# Patient Record
Sex: Male | Born: 1980 | Race: Black or African American | Hispanic: No | Marital: Single | Smoking: Never smoker
Health system: Southern US, Community
[De-identification: ages and names within clinical notes are randomized; demographics above are authoritative.]

## PROBLEM LIST (undated history)

## (undated) HISTORY — PX: OTHER SURGICAL HISTORY: SHX169

---

## 2009-03-28 ENCOUNTER — Ambulatory Visit: Payer: Self-pay | Admitting: Diagnostic Radiology

## 2009-03-28 ENCOUNTER — Emergency Department (HOSPITAL_BASED_OUTPATIENT_CLINIC_OR_DEPARTMENT_OTHER): Admission: EM | Admit: 2009-03-28 | Discharge: 2009-03-28 | Payer: Self-pay | Admitting: Emergency Medicine

## 2010-06-10 LAB — DIFFERENTIAL
Basophils Absolute: 0.1 10*3/uL (ref 0.0–0.1)
Eosinophils Absolute: 0.3 10*3/uL (ref 0.0–0.7)
Eosinophils Relative: 6 % — ABNORMAL HIGH (ref 0–5)
Monocytes Absolute: 0.4 10*3/uL (ref 0.1–1.0)
Monocytes Relative: 7 % (ref 3–12)
Neutro Abs: 2.4 10*3/uL (ref 1.7–7.7)

## 2010-06-10 LAB — D-DIMER, QUANTITATIVE: D-Dimer, Quant: 0.3 ug/mL-FEU (ref 0.00–0.48)

## 2010-06-10 LAB — CBC
MCV: 90.7 fL (ref 78.0–100.0)
Platelets: 207 10*3/uL (ref 150–400)
RDW: 12.4 % (ref 11.5–15.5)
WBC: 5.2 10*3/uL (ref 4.0–10.5)

## 2011-09-03 ENCOUNTER — Emergency Department (HOSPITAL_BASED_OUTPATIENT_CLINIC_OR_DEPARTMENT_OTHER)
Admission: EM | Admit: 2011-09-03 | Discharge: 2011-09-03 | Disposition: A | Discharge status: Home / Self Care | Payer: 59 | Attending: Emergency Medicine | Admitting: Emergency Medicine

## 2011-09-03 ENCOUNTER — Encounter (HOSPITAL_BASED_OUTPATIENT_CLINIC_OR_DEPARTMENT_OTHER): Payer: Self-pay | Admitting: Emergency Medicine

## 2011-09-03 DIAGNOSIS — X500XXA Overexertion from strenuous movement or load, initial encounter: Secondary | ICD-10-CM | POA: Insufficient documentation

## 2011-09-03 DIAGNOSIS — Y998 Other external cause status: Secondary | ICD-10-CM | POA: Insufficient documentation

## 2011-09-03 DIAGNOSIS — Y9389 Activity, other specified: Secondary | ICD-10-CM | POA: Insufficient documentation

## 2011-09-03 DIAGNOSIS — IMO0002 Reserved for concepts with insufficient information to code with codable children: Secondary | ICD-10-CM | POA: Insufficient documentation

## 2011-09-03 DIAGNOSIS — S39012A Strain of muscle, fascia and tendon of lower back, initial encounter: Secondary | ICD-10-CM

## 2011-09-03 DIAGNOSIS — Z79899 Other long term (current) drug therapy: Secondary | ICD-10-CM | POA: Insufficient documentation

## 2011-09-03 MED ORDER — KETOROLAC TROMETHAMINE 30 MG/ML IJ SOLN
30.0000 mg | Freq: Once | INTRAMUSCULAR | Status: AC
  Administered 2011-09-03: 30 mg via INTRAMUSCULAR
  Filled 2011-09-03: qty 1

## 2011-09-03 MED ORDER — DIAZEPAM 5 MG PO TABS: 5.0000 mg | ORAL_TABLET | Freq: Two times a day (BID) | ORAL | Status: AC | PRN

## 2011-09-03 MED ORDER — TRAMADOL HCL 50 MG PO TABS: 50.0000 mg | ORAL_TABLET | Freq: Four times a day (QID) | ORAL | Status: AC | PRN

## 2011-09-03 MED ORDER — IBUPROFEN 800 MG PO TABS: 800.0000 mg | ORAL_TABLET | Freq: Three times a day (TID) | ORAL | Status: AC

## 2011-09-03 NOTE — Discharge Instructions (Signed)
As we discussed, it is important to take the prescriptions as directed.  Please do not drive or operate any heavy machinery while taking the Valium or tramadol.  Please make sure to follow up as discussed, and consider physical therapy in addition to the exercises provided to you today for continued improvement of your back strain.

## 2011-09-03 NOTE — ED Notes (Signed)
Pt c/o lower middle back pain after working on lawnmower yesterday

## 2011-09-03 NOTE — ED Provider Notes (Signed)
History     CSN: [id number]  Arrival date & time 09/03/11  1478   First MD Initiated Contact with Patient 09/03/11 0831      Chief Complaint  Patient presents with  . Back Pain    (Consider location/radiation/quality/duration/timing/severity/associated sxs/prior treatment) HPI The patient presents with back pain.  He notes that approximately 24 hours ago bent over to fix something and since that time he said pain persists only about the mid lower back.  The pain is sharp, radiation bilaterally, it is worse with motion.  He has not taken any medication thus far.  He denies any ongoing fever, chills, dysuria, incontinence, difficulty walking or other focal complaints. He notes to similar prior episodes in the distant past.  No surgical history, no other notable medical problems. History reviewed. No pertinent past medical history.  History reviewed. No pertinent past surgical history.  No family history on file.  History  Substance Use Topics  . Smoking status: Never Smoker   . Smokeless tobacco: Not on file  . Alcohol Use: Yes     social      Review of Systems  All other systems reviewed and are negative.    Allergies  Review of patient's allergies indicates no known allergies.  Home Medications   Current Outpatient Rx  Name Route Sig Dispense Refill  . DIAZEPAM 5 MG PO TABS Oral Take 1 tablet (5 mg total) by mouth every 12 (twelve) hours as needed for anxiety. 10 tablet 0  . IBUPROFEN 800 MG PO TABS Oral Take 1 tablet (800 mg total) by mouth 3 (three) times daily. 12 tablet 0  . TRAMADOL HCL 50 MG PO TABS Oral Take 1 tablet (50 mg total) by mouth every 6 (six) hours as needed for pain. 15 tablet 0    BP 156/59  Pulse 74  Temp 98 F (36.7 C)  Resp 16  Ht 6\' 1"  (1.854 m)  Wt 255 lb (115.667 kg)  BMI 33.64 kg/m2  SpO2 99%  Physical Exam  Nursing note and vitals reviewed. Constitutional: He is oriented to person, place, and time. He appears well-developed.  No distress.  HENT:  Head: Normocephalic and atraumatic.  Eyes: Conjunctivae and EOM are normal.  Cardiovascular: Normal rate and regular rhythm.   Pulmonary/Chest: Effort normal. No stridor. No respiratory distress.  Abdominal: He exhibits no distension.  Musculoskeletal: He exhibits no edema.       5/5 strength in both lower extremities symmetric throughout.  Appropriate pulses in the distal lower extremities.  Gait is intact.  Neurological: He is alert and oriented to person, place, and time.  Skin: Skin is warm and dry.  Psychiatric: He has a normal mood and affect.    ED Course  Procedures (including critical care time)  Labs Reviewed - No data to display No results found.   1. Back strain       MDM  This otherwise well male presents with new back pain secondary to likely muscle strain.  On exam the patient is in no distress, has no focal neurologic deficits.  The patient's vital signs are unremarkable.  Given these findings, the patient is appropriate for continued care via outpatient physicians.  I discussed this with the patient, including a discussion on the need for physical therapy, and he was discharged in stable condition to follow up at a clinic.   Gerhard Munch, MD 09/03/11 [phone]

## 2011-11-03 ENCOUNTER — Emergency Department (HOSPITAL_BASED_OUTPATIENT_CLINIC_OR_DEPARTMENT_OTHER)
Admission: EM | Admit: 2011-11-03 | Discharge: 2011-11-03 | Disposition: A | Discharge status: Home / Self Care | Payer: 59 | Attending: Emergency Medicine | Admitting: Emergency Medicine

## 2011-11-03 ENCOUNTER — Encounter (HOSPITAL_BASED_OUTPATIENT_CLINIC_OR_DEPARTMENT_OTHER): Payer: Self-pay | Admitting: Emergency Medicine

## 2011-11-03 DIAGNOSIS — K047 Periapical abscess without sinus: Secondary | ICD-10-CM | POA: Insufficient documentation

## 2011-11-03 MED ORDER — OXYCODONE-ACETAMINOPHEN 5-325 MG PO TABS: 1.0000 | ORAL_TABLET | Freq: Four times a day (QID) | ORAL | Status: AC | PRN

## 2011-11-03 MED ORDER — OXYCODONE-ACETAMINOPHEN 5-325 MG PO TABS
2.0000 | ORAL_TABLET | Freq: Once | ORAL | Status: AC
  Administered 2011-11-03: 2 via ORAL
  Filled 2011-11-03 (×2): qty 2

## 2011-11-03 MED ORDER — PENICILLIN V POTASSIUM 250 MG PO TABS
500.0000 mg | ORAL_TABLET | Freq: Once | ORAL | Status: AC
  Administered 2011-11-03: 500 mg via ORAL
  Filled 2011-11-03: qty 2

## 2011-11-03 MED ORDER — PENICILLIN V POTASSIUM 500 MG PO TABS: 500.0000 mg | ORAL_TABLET | Freq: Four times a day (QID) | ORAL | Status: AC

## 2011-11-03 NOTE — ED Provider Notes (Signed)
History     CSN: [id number]  Arrival date & time 11/03/11  1055   First MD Initiated Contact with Patient 11/03/11 1120      Chief Complaint  Patient presents with  . Dental Pain    (Consider location/radiation/quality/duration/timing/severity/associated sxs/prior treatment) HPI Pt reports mild pain in L lower canine tooth for the last several days, began to have swelling to gum and face yesterday, complaining of moderate aching pain, worse with palpation. No fever or drainage. Planning to see dentist tomorrow.  No past medical history on file.  Past Surgical History  Procedure Date  . Arm surgery     No family history on file.  History  Substance Use Topics  . Smoking status: Never Smoker   . Smokeless tobacco: Not on file  . Alcohol Use: Yes     social      Review of Systems All other systems reviewed and are negative except as noted in HPI.   Allergies  Review of patient's allergies indicates no known allergies.  Home Medications   Current Outpatient Rx  Name Route Sig Dispense Refill  . IBUPROFEN 800 MG PO TABS Oral Take 800 mg by mouth every 8 (eight) hours as needed.      BP 145/69  Pulse 80  Temp 98.1 F (36.7 C) (Oral)  Resp 20  Ht 6\' 1"  (1.854 m)  Wt 245 lb (111.131 kg)  BMI 32.32 kg/m2  SpO2 100%  Physical Exam  Constitutional: He is oriented to person, place, and time. He appears well-developed and well-nourished.  HENT:  Head: Normocephalic and atraumatic.       Soft tissue swelling to L lower gums and face, no focal fluctuance or drainage  Neck: Neck supple.  Pulmonary/Chest: Effort normal.  Neurological: He is alert and oriented to person, place, and time. No cranial nerve deficit.  Psychiatric: He has a normal mood and affect. His behavior is normal.    ED Course  Procedures (including critical care time)  Labs Reviewed - No data to display No results found.   No diagnosis found.    MDM  Likely periapical abscess, PCN,  pain meds and dental followup.         Charles B. Bernette Mayers, MD 11/03/11 1148

## 2011-11-03 NOTE — ED Notes (Addendum)
Lower front teeth pain  x 2 days.  No known injury recently.   some swelling to lower lip this am.

## 2011-11-03 NOTE — ED Notes (Signed)
Pt c/o swelling to left lower lip and chin swelling x 1 day. Denies injury. Pt states he feels like it "has to do with my teeth." Pt c/o lower medial dental pain "on and off for several months" .

## 2012-12-14 ENCOUNTER — Encounter (HOSPITAL_BASED_OUTPATIENT_CLINIC_OR_DEPARTMENT_OTHER): Payer: Self-pay

## 2012-12-14 ENCOUNTER — Emergency Department (HOSPITAL_BASED_OUTPATIENT_CLINIC_OR_DEPARTMENT_OTHER)
Admission: EM | Admit: 2012-12-14 | Discharge: 2012-12-14 | Disposition: A | Discharge status: Home / Self Care | Payer: 59 | Attending: Emergency Medicine | Admitting: Emergency Medicine

## 2012-12-14 ENCOUNTER — Emergency Department (HOSPITAL_BASED_OUTPATIENT_CLINIC_OR_DEPARTMENT_OTHER): Payer: 59

## 2012-12-14 DIAGNOSIS — J329 Chronic sinusitis, unspecified: Secondary | ICD-10-CM

## 2012-12-14 DIAGNOSIS — R42 Dizziness and giddiness: Secondary | ICD-10-CM | POA: Insufficient documentation

## 2012-12-14 DIAGNOSIS — J019 Acute sinusitis, unspecified: Secondary | ICD-10-CM | POA: Insufficient documentation

## 2012-12-14 LAB — BASIC METABOLIC PANEL
CO2: 27 mEq/L (ref 19–32)
Calcium: 9.6 mg/dL (ref 8.4–10.5)
Potassium: 3.9 mEq/L (ref 3.5–5.1)

## 2012-12-14 LAB — CBC
HCT: 47.7 % (ref 39.0–52.0)
Hemoglobin: 16.4 g/dL (ref 13.0–17.0)
MCHC: 34.4 g/dL (ref 30.0–36.0)
Platelets: 218 10*3/uL (ref 150–400)
RBC: 5.28 MIL/uL (ref 4.22–5.81)

## 2012-12-14 MED ORDER — AMOXICILLIN-POT CLAVULANATE 875-125 MG PO TABS: 1.0000 | ORAL_TABLET | Freq: Two times a day (BID) | ORAL | Status: DC

## 2012-12-14 NOTE — ED Provider Notes (Signed)
CSN: [id number]     Arrival date & time 12/14/12  1014 History   First MD Initiated Contact with Patient 12/14/12 1026     Chief Complaint  Patient presents with  . Headache  . Dizziness   (Consider location/radiation/quality/duration/timing/severity/associated sxs/prior Treatment) HPI Comments: Pt states that he developed a headache in the back of his head last night and it went away with tylenol:pt states that he then developed a headache this morning in his face:pt states that he started with dizziness and off balance feeling today:pt states that with tylenol again this morning the symptoms have primarily resolved although he still has a headache:not the worst headache:no vomiting, blurred vision, confusion, fever:pt states that he did eat this morning  The history is provided by the patient. No language interpreter was used.    History reviewed. No pertinent past medical history. Past Surgical History  Procedure Laterality Date  . Arm surgery     No family history on file. History  Substance Use Topics  . Smoking status: Never Smoker   . Smokeless tobacco: Not on file  . Alcohol Use: Yes     Comment: social    Review of Systems  Constitutional: Negative.   Respiratory: Negative.   Cardiovascular: Negative.     Allergies  Review of patient's allergies indicates no known allergies.  Home Medications   Current Outpatient Rx  Name  Route  Sig  Dispense  Refill  . ibuprofen (ADVIL,MOTRIN) 800 MG tablet   Oral   Take 800 mg by mouth every 8 (eight) hours as needed.          BP 120/74  Pulse 66  Temp(Src) 98.1 F (36.7 C) (Oral)  Resp 16  Ht 6\' 1"  (1.854 m)  Wt 215 lb (97.523 kg)  BMI 28.37 kg/m2 Physical Exam  Nursing note and vitals reviewed. Constitutional: He is oriented to person, place, and time. He appears well-developed and well-nourished.  HENT:  Head: Normocephalic and atraumatic.  Right Ear: External ear normal.  Left Ear: External ear normal.   Nasal congestion  Eyes: Conjunctivae and EOM are normal. Pupils are equal, round, and reactive to light.  Cardiovascular: Normal rate and regular rhythm.   Pulmonary/Chest: Effort normal and breath sounds normal.  Musculoskeletal: Normal range of motion.  Neurological: He is alert and oriented to person, place, and time. Coordination normal.  Ambulatory at this time  Skin: Skin is warm and dry.    ED Course  Procedures (including critical care time) Labs Review Labs Reviewed  CBC - Abnormal; Notable for the following:    WBC 3.6 (*)    All other components within normal limits  BASIC METABOLIC PANEL   Imaging Review Ct Head Wo Contrast  12/14/2012   CLINICAL DATA:  32 year old male dizziness.  EXAM: CT HEAD WITHOUT CONTRAST  TECHNIQUE: Contiguous axial images were obtained from the base of the skull through the vertex without intravenous contrast.  COMPARISON:  None.  FINDINGS: Widespread paranasal sinus mucosal thickening and opacification. Adenoid soft tissue enlargement. Maxillary sinus mucous retention cysts.  Tympanic cavities and mastoids are clear. No acute osseous abnormality identified.  Visualized orbits and scalp soft tissues are within normal limits.  No midline shift, ventriculomegaly, mass effect, evidence of mass lesion, intracranial hemorrhage or evidence of cortically based acute infarction. Gray-white matter differentiation is within normal limits throughout the brain. No suspicious intracranial vascular hyperdensity.  IMPRESSION: 1. Normal noncontrast CT appearance of the brain.  2. Widespread paranasal sinus inflammatory changes  compatible with acute versus chronic sinusitis.   Electronically Signed   By: Augusto Gamble M.D.   On: 12/14/2012 11:38    MDM   1. Sinusitis    Acute sinusitis consistent with history:will treat:pt given augmentin and ent follow up     Teressa Lower, NP 12/14/12 1228

## 2012-12-14 NOTE — ED Provider Notes (Signed)
Medical screening examination/treatment/procedure(s) were performed by non-physician practitioner and as supervising physician I was immediately available for consultation/collaboration.  Brandey Vandalen, MD 12/14/12 1459 

## 2012-12-14 NOTE — ED Notes (Signed)
Pt reports headache and dizziness that started yesterday.

## 2015-03-04 IMAGING — CT CT HEAD W/O CM
1 series · 16 of 30 positions shown, 20 images · non-contrast
Comparison: None.

CLINICAL DATA: 32-year-old male dizziness.

EXAM:
CT HEAD WITHOUT CONTRAST
TECHNIQUE: Contiguous axial images were obtained from the base of the skull
through the vertex without intravenous contrast.

[Series 2: head 4.8 h37s · axial · 0.44mm/px · z∈[-105,+32]mm · 16 of 32 slices shown, 20 images]
[im 2/32  brain]
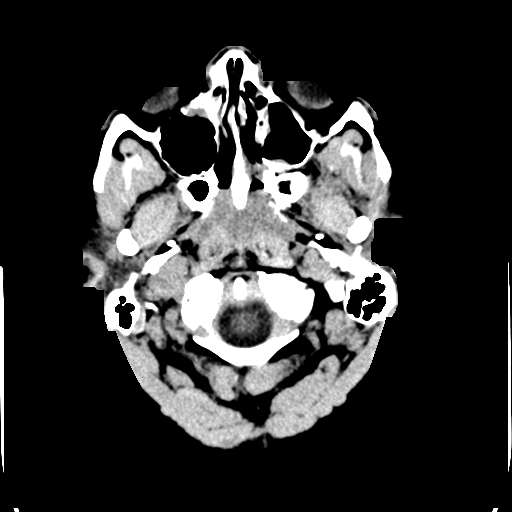
[im 2/32  bone]
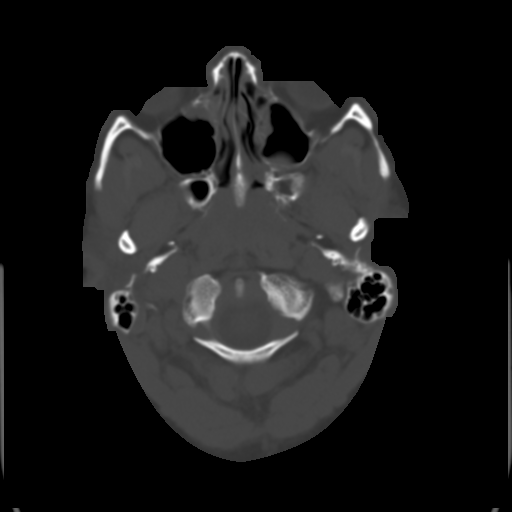
[im 4/32  brain]
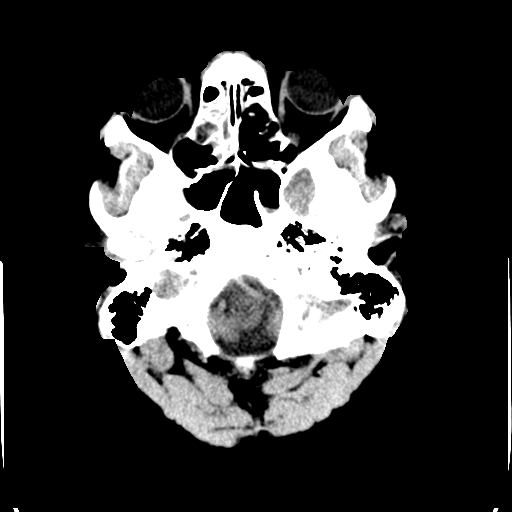
[im 6/32  brain]
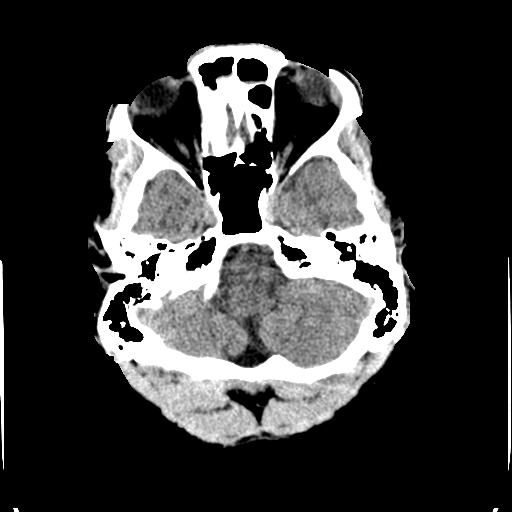
[im 8/32  brain]
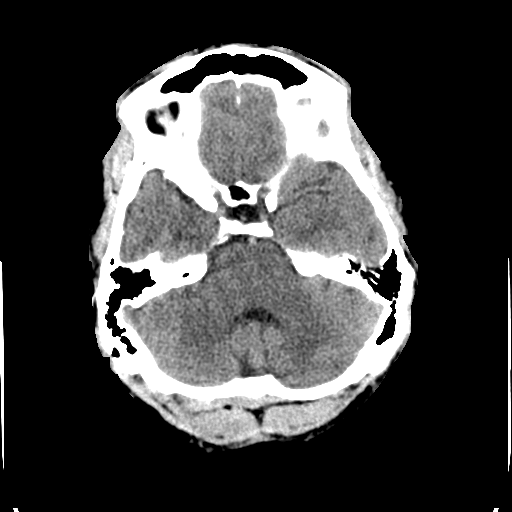
[im 9/32  brain]
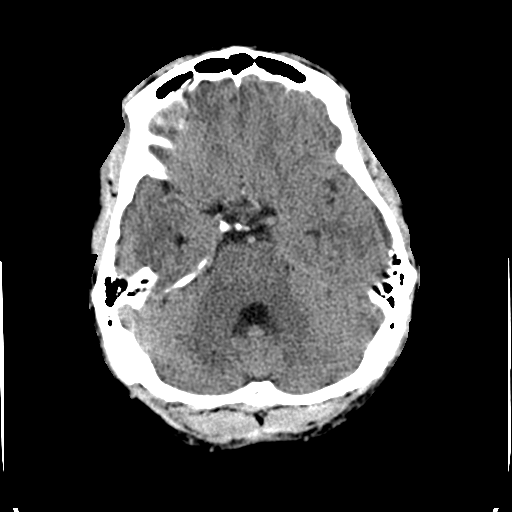
[im 9/32  bone]
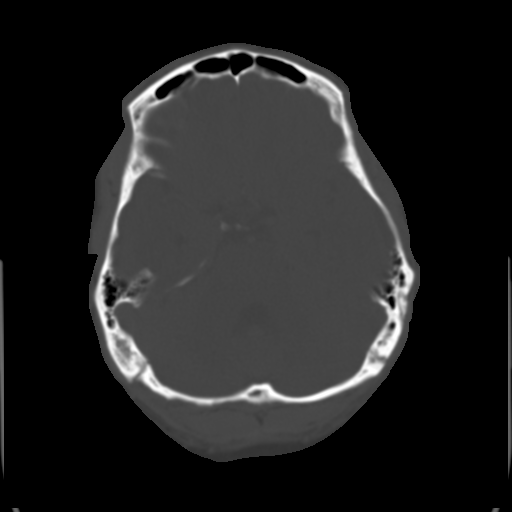
[im 11/32  brain]
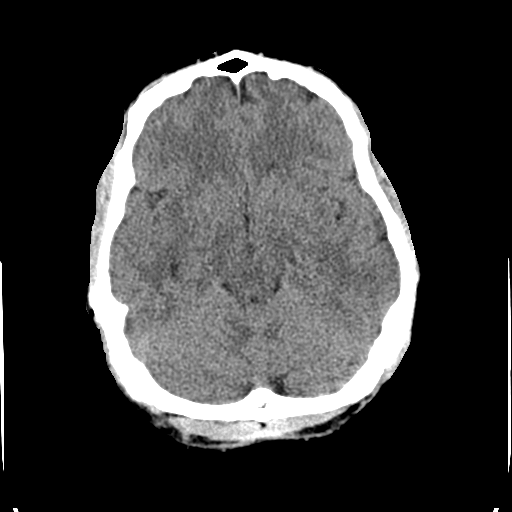
[im 13/32  brain]
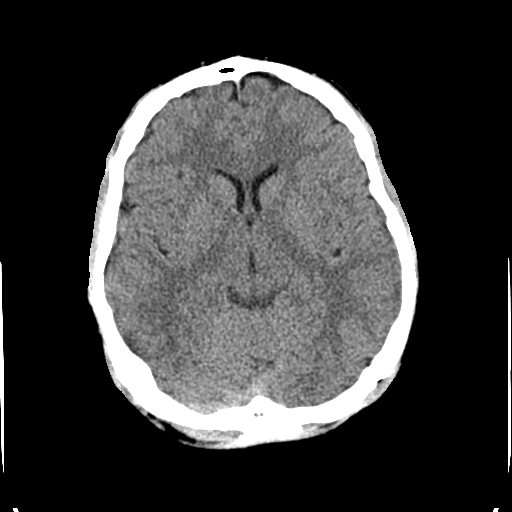
[im 15/32  brain]
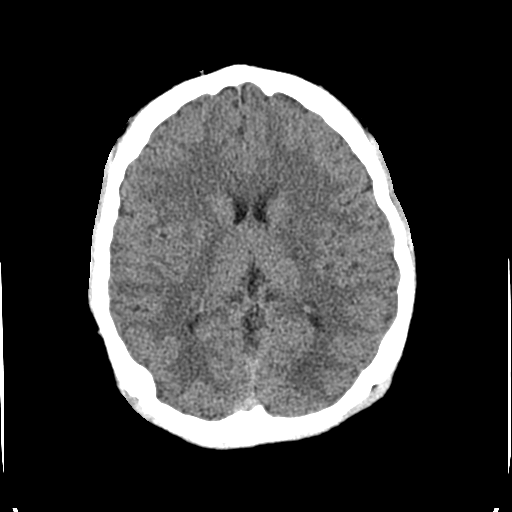
[im 17/32  brain]
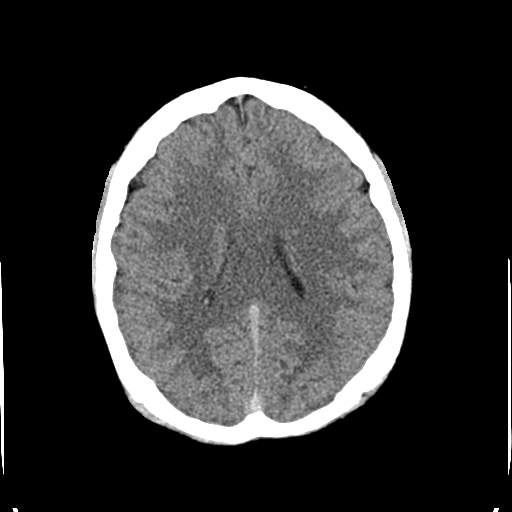
[im 17/32  bone]
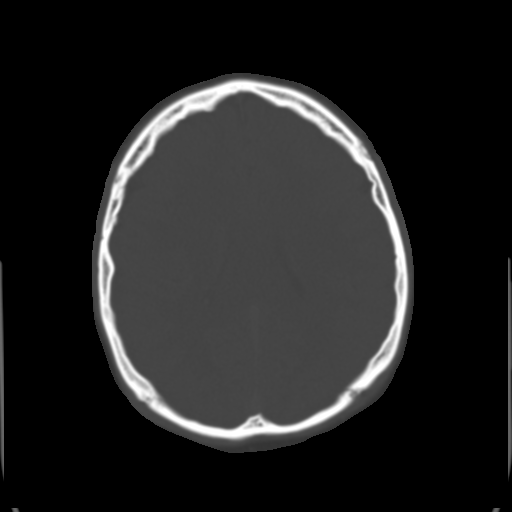
[im 19/32  brain]
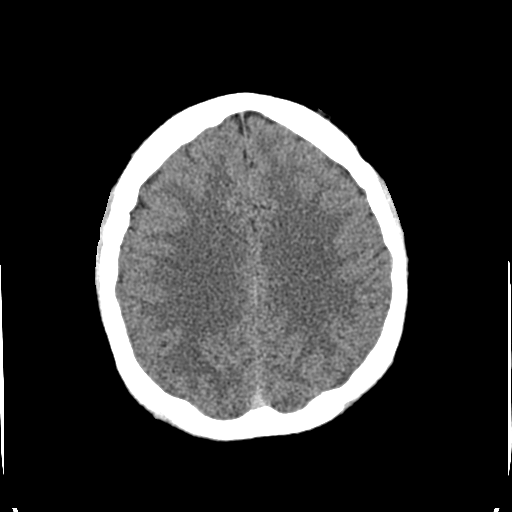
[im 21/32  brain]
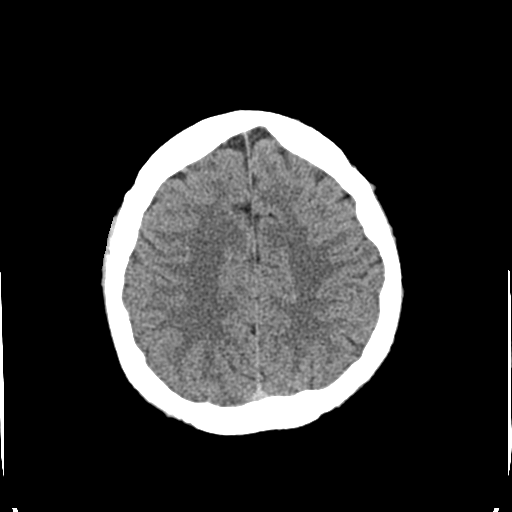
[im 23/32  brain]
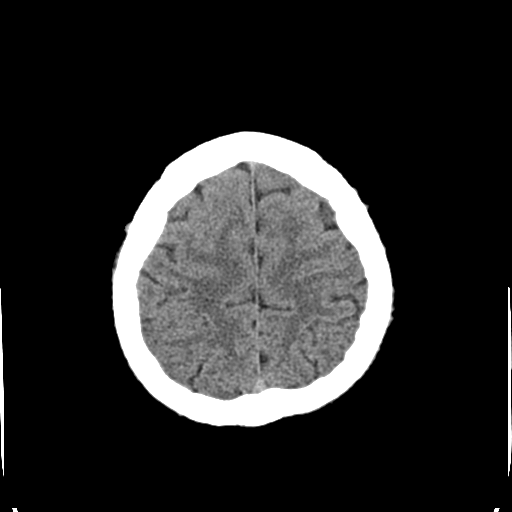
[im 24/32  brain]
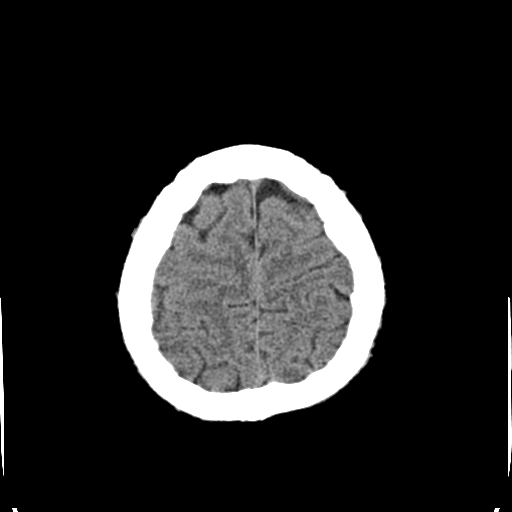
[im 24/32  bone]
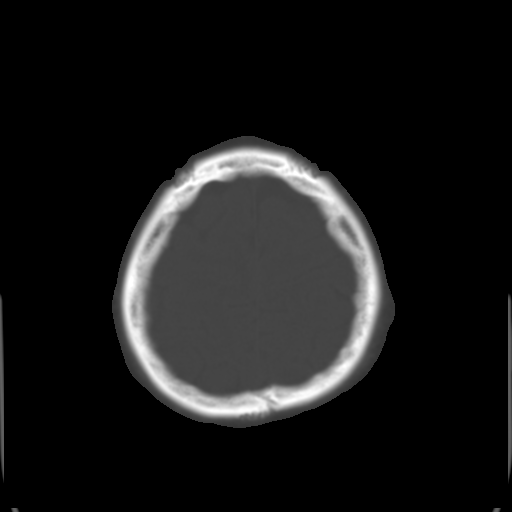
[im 26/32  brain]
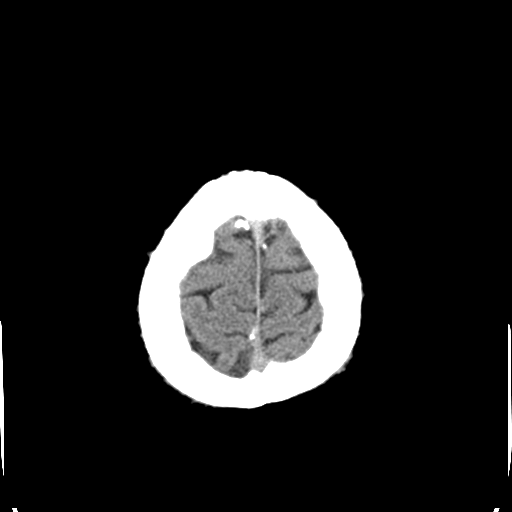
[im 28/32  brain]
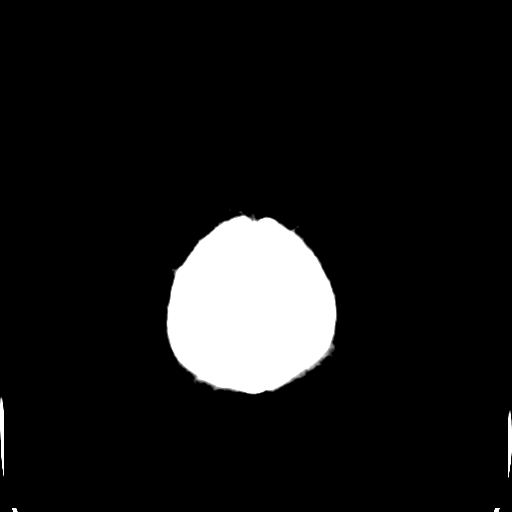
[im 30/32  brain]
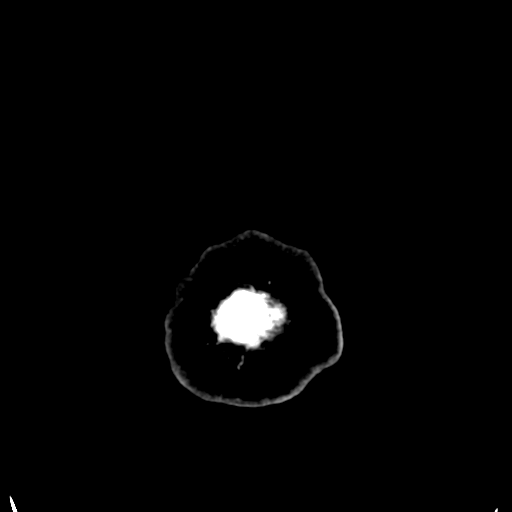

[16 of 30 positions shown; findings below may reference images not displayed]

FINDINGS: Widespread paranasal sinus mucosal thickening and opacification.
Adenoid soft tissue enlargement. Maxillary sinus mucous retention
cysts.

Tympanic cavities and mastoids are clear. No acute osseous
abnormality identified.

Visualized orbits and scalp soft tissues are within normal limits.

No midline shift, ventriculomegaly, mass effect, evidence of mass
lesion, intracranial hemorrhage or evidence of cortically based
acute infarction. Gray-white matter differentiation is within normal
limits throughout the brain. No suspicious intracranial vascular
hyperdensity.
IMPRESSION: 1. Normal noncontrast CT appearance of the brain.

2. Widespread paranasal sinus inflammatory changes compatible with
acute versus chronic sinusitis.

## 2015-10-12 ENCOUNTER — Encounter (HOSPITAL_BASED_OUTPATIENT_CLINIC_OR_DEPARTMENT_OTHER): Payer: Self-pay | Admitting: *Deleted

## 2015-10-12 ENCOUNTER — Emergency Department (HOSPITAL_BASED_OUTPATIENT_CLINIC_OR_DEPARTMENT_OTHER)
Admission: EM | Admit: 2015-10-12 | Discharge: 2015-10-12 | Disposition: A | Discharge status: Home / Self Care | Payer: 59 | Attending: Emergency Medicine | Admitting: Emergency Medicine

## 2015-10-12 DIAGNOSIS — M545 Low back pain: Secondary | ICD-10-CM | POA: Insufficient documentation

## 2015-10-12 DIAGNOSIS — M25551 Pain in right hip: Secondary | ICD-10-CM | POA: Diagnosis not present

## 2015-10-12 MED ORDER — MELOXICAM 15 MG PO TABS: 15.0000 mg | ORAL_TABLET | Freq: Every day | ORAL | Status: DC

## 2015-10-12 MED ORDER — CYCLOBENZAPRINE HCL 10 MG PO TABS: 10.0000 mg | ORAL_TABLET | Freq: Two times a day (BID) | ORAL | Status: DC | PRN

## 2015-10-12 MED FILL — CYCLOBENZAPRINE 10 MG TAB: 10 | 10 days supply | Qty: 20 | Fill #0

## 2015-10-12 MED FILL — MELOXICAM 15 MG TABLET: 15 | 45 days supply | Qty: 45 | Fill #0

## 2015-10-12 NOTE — ED Provider Notes (Signed)
CSN: [id number]     Arrival date & time 10/12/15  1024 History   First MD Initiated Contact with Patient 10/12/15 1039     Chief Complaint  Patient presents with  . Back Pain     (Consider location/radiation/quality/duration/timing/severity/associated sxs/prior Treatment) HPI   Patient is a 35 year old male with no significant past medical history who presents to the ED with right-sided lower back pain since yesterday morning. Pain at rest is achy, nonradiating, 3/10. Pain worse with movement and walking which is sharp, nonradiating, 8/10. He has not tried anything for the pain. Patient had a similar episode of back pain 3 weeks ago that resolved on its own after roughly 3 days. Patient has a history of back injury in 2004 from lifting a heavy box. He has had intermittent episodes of right-sided lower back pain since then. Patient denies numbness/timing, weakness, saddle anesthesia, loss of bowel bladder function, night sweats, fever, abdominal pain, history of IVDU.  History reviewed. No pertinent past medical history. Past Surgical History  Procedure Laterality Date  . Arm surgery     History reviewed. No pertinent family history. Social History  Substance Use Topics  . Smoking status: Never Smoker   . Smokeless tobacco: None  . Alcohol Use: Yes     Comment: social    Review of Systems  Constitutional: Negative for fever and chills.  Gastrointestinal: Negative for vomiting, abdominal pain and diarrhea.  Musculoskeletal: Positive for back pain. Negative for neck pain.  Skin: Negative for rash.  Neurological: Negative for weakness, numbness and headaches.      Allergies  Review of patient's allergies indicates no known allergies.  Home Medications   Prior to Admission medications   Medication Sig Start Date End Date Taking? Authorizing Provider  amoxicillin-clavulanate (AUGMENTIN) 875-125 MG per tablet Take 1 tablet by mouth every 12 (twelve) hours. 12/14/12   Teressa LowerVrinda  Pickering, NP  cyclobenzaprine (FLEXERIL) 10 MG tablet Take 1 tablet (10 mg total) by mouth 2 (two) times daily as needed for muscle spasms. 10/12/15   Jerre SimonJessica L Rama Mcclintock, PA  ibuprofen (ADVIL,MOTRIN) 800 MG tablet Take 800 mg by mouth every 8 (eight) hours as needed.    Historical Provider, MD  meloxicam (MOBIC) 15 MG tablet Take 1 tablet (15 mg total) by mouth daily. 10/12/15   Janaia Kozel L Tanetta Fuhriman, PA   BP 128/76 mmHg  Pulse 72  Temp(Src) 98.8 F (37.1 C) (Oral)  Resp 18  Ht 6\' 1"  (1.854 m)  Wt 102.513 kg  BMI 29.82 kg/m2  SpO2 97% Physical Exam  Constitutional: He appears well-developed and well-nourished. No distress.  HENT:  Head: Normocephalic and atraumatic.  Eyes: Conjunctivae are normal.  Neck: Normal range of motion.  Pulmonary/Chest: Effort normal. No respiratory distress.  Musculoskeletal: Normal range of motion.  Examination of lower back revealed no step-offs, no deformities, no ecchymosis. Full AROM of thoracic, lumbar spine and bilateral lower extremities. Strength 5/5 of bilateral lower extremities including plantar flexion and extension. Pain with straight leg raise of right leg. Sensation intact to light touch of bilateral lower extremities. TTP to right paraspinal muscles and right SI joint. TTP to right buttocks just lateral to greater trochanter. No TTP to greater trochanter. No hip pain with internal or external rotation of right hip.  Neurological: He is alert. Coordination normal.  Skin: Skin is warm and dry. He is not diaphoretic.  Psychiatric: He has a normal mood and affect. His behavior is normal.  Nursing note and vitals reviewed.  ED Course  Procedures (including critical care time) Labs Review Labs Reviewed - No data to display  Imaging Review No results found. I have personally reviewed and evaluated these images and lab results as part of my medical decision-making.   EKG Interpretation None      MDM   Final diagnoses:  Right low back pain,  with sciatica presence unspecified  Right hip pain    Patient with back pain.  No neurological deficits and normal neuro exam.  Patient can walk but states is painful.  No loss of bowel or bladder control.  No concern for cauda equina.  No fever, night sweats, weight loss, h/o cancer, IVDU.  RICE protocol and pain medicine indicated and discussed with patient. Instructed patient to follow up with primary care provider and orthopedics regarding his chronic back pain. Discussed strict return precautions. Patient expressed understanding to the discharge instructions.     Jerre Simon, PA 10/12/15 1127  Marily Memos, MD 10/12/15 1257

## 2015-10-12 NOTE — Discharge Instructions (Signed)
Follow-up with your primary care provider within 2 days to be reevaluated for your back pain. Follow-up with orthopedics regarding your chronic back pain. Take the Mobic as prescribed. Be sure to eat when taking this medication as it can be hard on your stomach. Take the Flexeril at night. Do not drive or operate machinery while on this medication as it may cause drowsiness. Do not drink alcohol on this medication. Ice your back as often as you can.   Return to the emergency department if you experience worsening back pain, pain shooting down your buttocks into your leg, numbness/tingling, weakness of your leg, loss of bowel or bladder function, fever, chills, nausea, vomiting.  Back Pain, Adult Back pain is very common in adults.The cause of back pain is rarely dangerous and the pain often gets better over time.The cause of your back pain may not be known. Some common causes of back pain include:  Strain of the muscles or ligaments supporting the spine.  Wear and tear (degeneration) of the spinal disks.  Arthritis.  Direct injury to the back. For many people, back pain may return. Since back pain is rarely dangerous, most people can learn to manage this condition on their own. HOME CARE INSTRUCTIONS Watch your back pain for any changes. The following actions may help to lessen any discomfort you are feeling:  Remain active. It is stressful on your back to sit or stand in one place for long periods of time. Do not sit, drive, or stand in one place for more than 30 minutes at a time. Take short walks on even surfaces as soon as you are able.Try to increase the length of time you walk each day.  Exercise regularly as directed by your health care provider. Exercise helps your back heal faster. It also helps avoid future injury by keeping your muscles strong and flexible.  Do not stay in bed.Resting more than 1-2 days can delay your recovery.  Pay attention to your body when you bend and  lift. The most comfortable positions are those that put less stress on your recovering back. Always use proper lifting techniques, including:  Bending your knees.  Keeping the load close to your body.  Avoiding twisting.  Find a comfortable position to sleep. Use a firm mattress and lie on your side with your knees slightly bent. If you lie on your back, put a pillow under your knees.  Avoid feeling anxious or stressed.Stress increases muscle tension and can worsen back pain.It is important to recognize when you are anxious or stressed and learn ways to manage it, such as with exercise.  Take medicines only as directed by your health care provider. Over-the-counter medicines to reduce pain and inflammation are often the most helpful.Your health care provider may prescribe muscle relaxant drugs.These medicines help dull your pain so you can more quickly return to your normal activities and healthy exercise.  Apply ice to the injured area:  Put ice in a plastic bag.  Place a towel between your skin and the bag.  Leave the ice on for 20 minutes, 2-3 times a day for the first 2-3 days. After that, ice and heat may be alternated to reduce pain and spasms.  Maintain a healthy weight. Excess weight puts extra stress on your back and makes it difficult to maintain good posture. SEEK MEDICAL CARE IF:  You have pain that is not relieved with rest or medicine.  You have increasing pain going down into the legs or buttocks.  You have pain that does not improve in one week.  You have night pain.  You lose weight.  You have a fever or chills. SEEK IMMEDIATE MEDICAL CARE IF:   You develop new bowel or bladder control problems.  You have unusual weakness or numbness in your arms or legs.  You develop nausea or vomiting.  You develop abdominal pain.  You feel faint.   This information is not intended to replace advice given to you by your health care provider. Make sure you discuss  any questions you have with your health care provider.   Document Released: 03/11/2005 Document Revised: 04/01/2014 Document Reviewed: 07/13/2013 Elsevier Interactive Patient Education [employer]

## 2015-10-12 NOTE — ED Notes (Signed)
Pt reports right sided low back pain x yesterday that has spread to his right hip and down his right leg. Denies any injury or trauma.

## 2017-02-13 ENCOUNTER — Emergency Department (HOSPITAL_BASED_OUTPATIENT_CLINIC_OR_DEPARTMENT_OTHER): Payer: 59

## 2017-02-13 ENCOUNTER — Emergency Department (HOSPITAL_BASED_OUTPATIENT_CLINIC_OR_DEPARTMENT_OTHER)
Admission: EM | Admit: 2017-02-13 | Discharge: 2017-02-13 | Disposition: A | Discharge status: Home / Self Care | Payer: 59 | Attending: Emergency Medicine | Admitting: Emergency Medicine

## 2017-02-13 ENCOUNTER — Encounter (HOSPITAL_BASED_OUTPATIENT_CLINIC_OR_DEPARTMENT_OTHER): Payer: Self-pay | Admitting: Emergency Medicine

## 2017-02-13 ENCOUNTER — Other Ambulatory Visit: Payer: Self-pay

## 2017-02-13 DIAGNOSIS — M79671 Pain in right foot: Secondary | ICD-10-CM | POA: Insufficient documentation

## 2017-02-13 DIAGNOSIS — Z79899 Other long term (current) drug therapy: Secondary | ICD-10-CM | POA: Insufficient documentation

## 2017-02-13 MED ORDER — HYDROCODONE-ACETAMINOPHEN 5-325 MG PO TABS: 2.0000 | ORAL_TABLET | Freq: Four times a day (QID) | ORAL | 0 refills | Status: AC | PRN

## 2017-02-13 MED ORDER — HYDROCODONE-ACETAMINOPHEN 5-325 MG PO TABS
2.0000 | ORAL_TABLET | Freq: Once | ORAL | Status: AC
  Administered 2017-02-13: 2 via ORAL
  Filled 2017-02-13: qty 2

## 2017-02-13 MED ORDER — IBUPROFEN 800 MG PO TABS: 800.0000 mg | ORAL_TABLET | Freq: Three times a day (TID) | ORAL | 0 refills | Status: AC | PRN

## 2017-02-13 MED ORDER — IBUPROFEN 800 MG PO TABS
800.0000 mg | ORAL_TABLET | Freq: Once | ORAL | Status: AC
  Administered 2017-02-13: 800 mg via ORAL
  Filled 2017-02-13: qty 1

## 2017-02-13 NOTE — ED Notes (Signed)
Pt verbalizes understanding of d/c instructions and denies any further needs at this time. 

## 2017-02-13 NOTE — ED Provider Notes (Signed)
TIME SEEN: 3:36 AM  CHIEF COMPLAINT: Right foot pain  HPI: Patient is a 36 year old male with no significant past medical history who presents to the emergency department with pain to his right foot for the past several days.  Has noted swelling.  States that he is twisted his foot several times but does not recall a specific injury that led to his symptoms.  No redness or warmth.  No fever.  No numbness or tingling.  No calf pain or calf tenderness.  Pain is not worse in the morning when he gets up.  He states it is worse actually when he walks around all day.  He is able to ambulate.  ROS: See HPI Constitutional: no fever  Eyes: no drainage  ENT: no runny nose   Cardiovascular:  no chest pain  Resp: no SOB  GI: no vomiting GU: no dysuria Integumentary: no rash  Allergy: no hives  Musculoskeletal: no leg swelling  Neurological: no slurred speech ROS otherwise negative  PAST MEDICAL HISTORY/PAST SURGICAL HISTORY:  History reviewed. No pertinent past medical history.  MEDICATIONS:  Prior to Admission medications   Medication Sig Start Date End Date Taking? Authorizing Provider  amoxicillin-clavulanate (AUGMENTIN) 875-125 MG per tablet Take 1 tablet by mouth every 12 (twelve) hours. 12/14/12   Teressa LowerPickering, Vrinda, NP  cyclobenzaprine (FLEXERIL) 10 MG tablet Take 1 tablet (10 mg total) by mouth 2 (two) times daily as needed for muscle spasms. 10/12/15   Focht, Joyce CopaJessica L, PA  ibuprofen (ADVIL,MOTRIN) 800 MG tablet Take 800 mg by mouth every 8 (eight) hours as needed.    [provider]  meloxicam (MOBIC) 15 MG tablet Take 1 tablet (15 mg total) by mouth daily. 10/12/15   Jerre SimonFocht, Jessica L, PA    ALLERGIES:  No Known Allergies  SOCIAL HISTORY:  Social History   Tobacco Use  . Smoking status: Never Smoker  Substance Use Topics  . Alcohol use: Yes    Comment: social    FAMILY HISTORY: No family history on file.  EXAM: BP 133/83 (BP Location: Left Arm)   Pulse 74   Temp  97.9 F (36.6 C) (Oral)   Resp 17   Ht 6\' 2"  (1.88 m)   Wt 116.6 kg (257 lb)   SpO2 97%   BMI 33.00 kg/m  CONSTITUTIONAL: Alert and oriented and responds appropriately to questions. Well-appearing; well-nourished HEAD: Normocephalic EYES: Conjunctivae clear, pupils appear equal, EOMI ENT: normal nose; moist mucous membranes NECK: Supple, no meningismus, no nuchal rigidity, no LAD  CARD: RRR; S1 and S2 appreciated; no murmurs, no clicks, no rubs, no gallops RESP: Normal chest excursion without splinting or tachypnea; breath sounds clear and equal bilaterally; no wheezes, no rhonchi, no rales, no hypoxia or respiratory distress, speaking full sentences ABD/GI: Normal bowel sounds; non-distended; soft, non-tender, no rebound, no guarding, no peritoneal signs, no hepatosplenomegaly BACK:  The back appears normal and is non-tender to palpation, there is no CVA tenderness EXT: She is tender to palpation over the foot and over the plantar fascia.  There is no redness or warmth but there is swelling.  No ecchymosis.  No bony deformity.  No joint effusion.  Normal range of motion in the ankle with no ligamentous laxity.  2+ DP pulses bilaterally.  Normal ROM in all joints; otherwise extremities are non-tender to palpation; no edema; normal capillary refill; no cyanosis, no calf tenderness or swelling    SKIN: Normal color for age and race; warm; no rash NEURO: Moves all  extremities equally, normal sensation diffusely PSYCH: The patient's mood and manner are appropriate. Grooming and personal hygiene are appropriate.  MEDICAL DECISION MAKING: Patient here with right foot pain.  Discussed with him that this could be plantar fasciitis but he does not report that it is worse in the morning and improves after stretching the foot and walking.  He could also be a foot sprain given recent twisting of the foot.  No fracture seen on x-ray.  No gout, septic arthritis or cellulitis.  Doubt DVT.  I recommended  NSAIDs, ice and elevation.  We will wrap his foot.  He declines crutches.  Will discharge with short course of pain medicine and given outpatient follow-up if medical management does not improve his symptoms.  At this time, I do not feel there is any life-threatening condition present. I have reviewed and discussed all results (EKG, imaging, lab, urine as appropriate) and exam findings with patient/family. I have reviewed nursing notes and appropriate previous records.  I feel the patient is safe to be discharged home without further emergent workup and can continue workup as an outpatient as needed. Discussed usual and customary return precautions. Patient/family verbalize understanding and are comfortable with this plan.  Outpatient follow-up has been provided if needed. All questions have been answered.      Ward, Layla MawKristen N, DO 02/13/17 [phone]

## 2017-02-13 NOTE — ED Triage Notes (Signed)
Pt presents with c/o right foot pain without injury.

## 2017-02-13 NOTE — ED Notes (Signed)
Patient transported to X-ray 

## 2017-09-08 ENCOUNTER — Other Ambulatory Visit: Payer: Self-pay

## 2017-09-08 ENCOUNTER — Encounter (HOSPITAL_BASED_OUTPATIENT_CLINIC_OR_DEPARTMENT_OTHER): Payer: Self-pay | Admitting: *Deleted

## 2017-09-08 ENCOUNTER — Emergency Department (HOSPITAL_BASED_OUTPATIENT_CLINIC_OR_DEPARTMENT_OTHER)
Admission: EM | Admit: 2017-09-08 | Discharge: 2017-09-08 | Disposition: A | Discharge status: Home / Self Care | Payer: 59 | Attending: Physician Assistant | Admitting: Physician Assistant

## 2017-09-08 DIAGNOSIS — M545 Low back pain: Secondary | ICD-10-CM

## 2017-09-08 MED ORDER — METHOCARBAMOL 500 MG PO TABS: 500.0000 mg | ORAL_TABLET | Freq: Two times a day (BID) | ORAL | 0 refills | Status: AC | PRN

## 2017-09-08 MED ORDER — NAPROXEN 500 MG PO TABS: 500.0000 mg | ORAL_TABLET | Freq: Two times a day (BID) | ORAL | 0 refills | Status: AC | PRN

## 2017-09-08 MED FILL — METHOCARBAMOL 500 MG TABLET: 500 | 10 days supply | Qty: 20 | Fill #0

## 2017-09-08 MED FILL — NAPROXEN 500 MG TABLET: 500 | 15 days supply | Qty: 30 | Fill #0

## 2017-09-08 NOTE — ED Triage Notes (Signed)
3 days pain in his lower back with radiation into his right leg. He drives for a living.

## 2017-09-08 NOTE — Discharge Instructions (Signed)
It was my pleasure taking care of you today!  ° °You have been seen in the Emergency Department today for back pain.  ° °Naproxen as needed for pain. Robaxin is your muscle relaxer to take as needed. In addition to this, use ice and/or heat for additional pain relief. ° °Your back pain should get better over the next 2 weeks. Please follow up with your doctor this week for a recheck if still having symptoms. ° °COLD THERAPY DIRECTIONS:  °Ice or gel packs can be used to reduce both pain and swelling. Ice is the most helpful within the first 24 to 48 hours after an injury or flareup from overusing a muscle or joint.  Ice is effective, has very few side effects, and is safe for most people to use.  ° ° °Return to the ED for worsening back pain, fever, weakness or numbness of either leg, or if you develop either (1) an inability to urinate or have bowel movements, or (2) loss of your ability to control your bathroom functions (if you start having "accidents"), or if you develop other new symptoms that concern you. °

## 2017-09-08 NOTE — ED Provider Notes (Signed)
[facility] EMERGENCY DEPARTMENT Provider Note   CSN: [id number] Arrival date & time: 09/08/17  1429     History   Chief Complaint Chief Complaint  Patient presents with  . Leg Pain    HPI Caleb Wright is a 37 y.o. male.  The history is provided by the patient and medical records. No language interpreter was used.  Leg Pain   Pertinent negatives include no numbness.   Caleb Wright is an otherwise healthy 37 y.o. male who presents to the Emergency Department complaining of acute onset of right-sided back pain which occurred while he was getting out of his truck with heavy package while at work.  He turned /twisted and felt acute onset of pain which radiated down his right leg.  Pain described as a sharp sensation.  The pain radiating down his leg lasted about 5 minutes and now has resolved.  He currently is complaining of tightness to the right low back and hip area.  Pain is worse in the morning, but improves as he moves around throughout the day.  He has taken Advil with mild improvement. Patient denies upper back or neck pain. No fever, saddle anesthesia, weakness, numbness, urinary complaints including retention/incontinence. No history of cancer, IVDU, or recent spinal procedures.    History reviewed. No pertinent past medical history.  There are no active problems to display for this patient.   Past Surgical History:  Procedure Laterality Date  . arm surgery          Home Medications    Prior to Admission medications   Medication Sig Start Date End Date Taking? Authorizing Provider  HYDROcodone-acetaminophen (NORCO/VICODIN) 5-325 MG tablet Take 2 tablets by mouth every 6 (six) hours as needed. 02/13/17   Ward, Layla MawKristen N, DO  ibuprofen (ADVIL,MOTRIN) 800 MG tablet Take 1 tablet (800 mg total) by mouth every 8 (eight) hours as needed for mild pain. 02/13/17   Ward, Layla MawKristen N, DO  methocarbamol (ROBAXIN) 500 MG tablet Take 1 tablet (500 mg total) by  mouth 2 (two) times daily as needed (Muscle tightness). 09/08/17   Ward, Chase PicketJaime Pilcher, PA-C  naproxen (NAPROSYN) 500 MG tablet Take 1 tablet (500 mg total) by mouth 2 (two) times daily as needed. 09/08/17   Ward, Chase PicketJaime Pilcher, PA-C    Family History No family history on file.  Social History Social History   Tobacco Use  . Smoking status: Never Smoker  . Smokeless tobacco: Never Used  Substance Use Topics  . Alcohol use: Yes    Comment: social  . Drug use: No     Allergies   Patient has no known allergies.   Review of Systems Review of Systems  Constitutional: Negative for fever.  Gastrointestinal: Negative for abdominal pain, nausea and vomiting.  Genitourinary: Negative for difficulty urinating.  Musculoskeletal: Positive for back pain. Negative for neck pain.  Skin: Negative for color change and wound.  Neurological: Negative for weakness and numbness.     Physical Exam Updated Vital Signs BP 116/74   Pulse 73   Temp 98 F (36.7 C) (Oral)   Resp 20   Ht 6\' 2"  (1.88 m)   Wt 116.6 kg (257 lb)   SpO2 98%   BMI 33.00 kg/m   Physical Exam  Constitutional: He is oriented to person, place, and time. He appears well-developed and well-nourished.  Neck:  No midline or paraspinal tenderness. Full ROM without pain.  Cardiovascular: Normal rate, regular rhythm, normal heart sounds and intact distal  pulses.  Pulmonary/Chest: Effort normal and breath sounds normal. No respiratory distress.  Abdominal: Soft. Bowel sounds are normal. He exhibits no distension. There is no tenderness.  Musculoskeletal:  No midline T/L tenderness. Tenderness to palpation of right lumbar paraspinal musculature. 5/5 muscle strength in bilateral LE's. Straight leg raises are bilaterally for radicular symptoms. Able to ambulate independently with steady gait.  Neurological: He is alert and oriented to person, place, and time. He has normal reflexes.  Bilateral lower extremities neurovascularly  intact.  Skin: Skin is warm and dry. No rash noted. No erythema.  Nursing note and vitals reviewed.    ED Treatments / Results  Labs (all labs ordered are listed, but only abnormal results are displayed) Labs Reviewed - No data to display  EKG None  Radiology No results found.  Procedures Procedures (including critical care time)  Medications Ordered in ED Medications - No data to display   Initial Impression / Assessment and Plan / ED Course  I have reviewed the triage vital signs and the nursing notes.  Pertinent labs & imaging results that were available during my care of the patient were reviewed by me and considered in my medical decision making (see chart for details).    Caleb Wright is a 37 y.o. male who presents to ED for right-sided back pain.   Patient demonstrates no lower extremity weakness, saddle anesthesia, bowel or bladder incontinence or neuro deficits. No concern for cauda equina. No fevers or other infectious symptoms to suggest that the patient's back pain is due to an infection. Lower extremities are neurovascularly intact and patient is ambulating without difficulty. I have reviewed return precautions, including the development of any of these signs or symptoms and the patient has voiced understanding. I reviewed symptomatic home care instructions and PCP follow-up if symptoms do not improve. RX for naproxen/robaxin. Patient voiced understanding and agreement with plan. All questions answered.   Final Clinical Impressions(s) / ED Diagnoses   Final diagnoses:  Acute right-sided low back pain, with sciatica presence unspecified    ED Discharge Orders        Ordered    naproxen (NAPROSYN) 500 MG tablet  2 times daily PRN     09/08/17 1555    methocarbamol (ROBAXIN) 500 MG tablet  2 times daily PRN     09/08/17 1555       Ward, Chase Picket, PA-C 09/08/17 1602    Abelino Derrick, MD 09/08/17 2357

## 2019-05-04 IMAGING — CR DG FOOT COMPLETE 3+V*R*
3 series · 3 of 3 positions shown · non-contrast
Comparison: None.

CLINICAL DATA: Right foot arch pain today.  No injury.

EXAM:
RIGHT FOOT COMPLETE - 3+ VIEW

[t foot ap right]
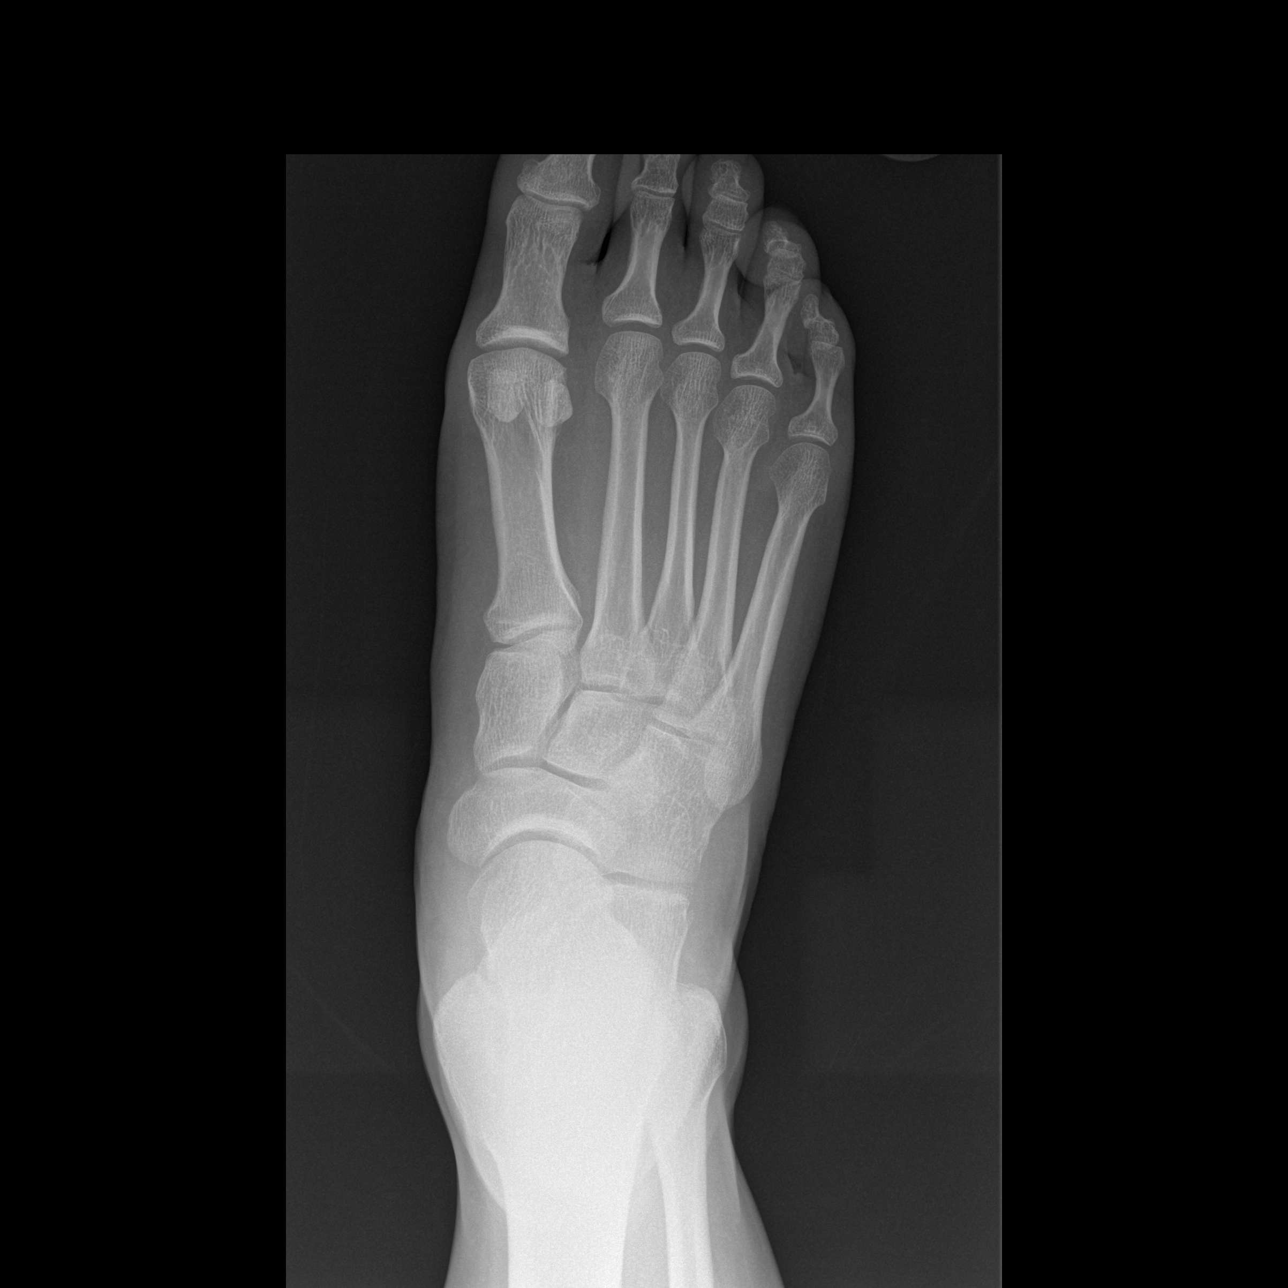

[t foot oblique right]
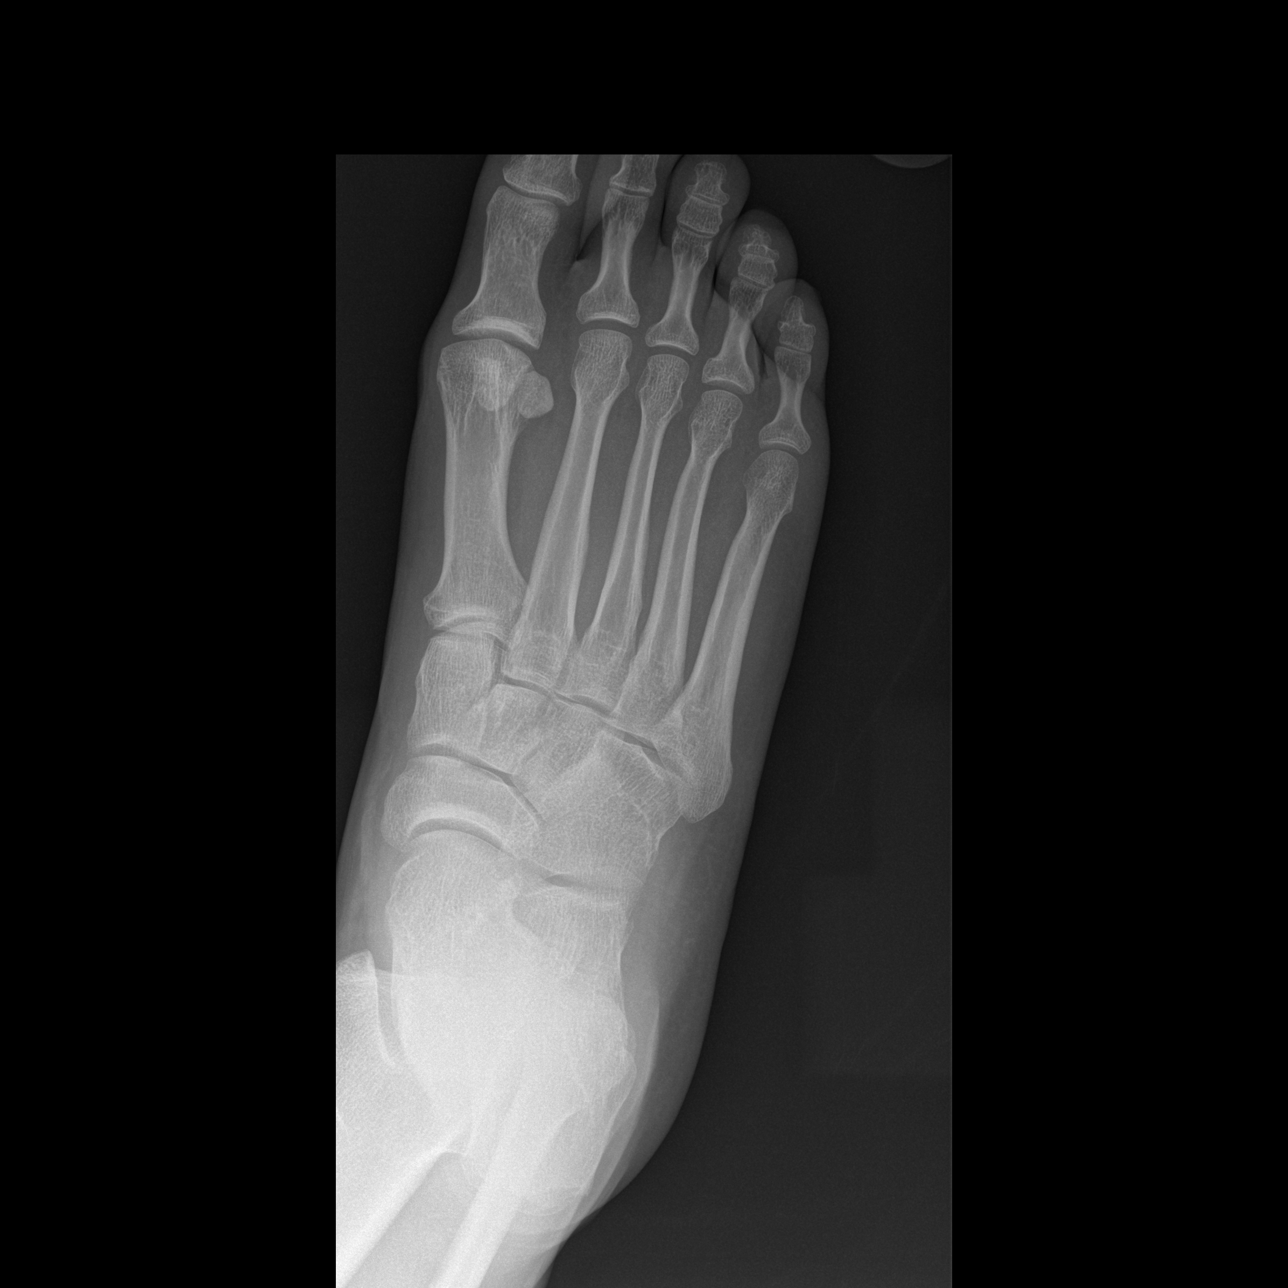

[t foot lat right]
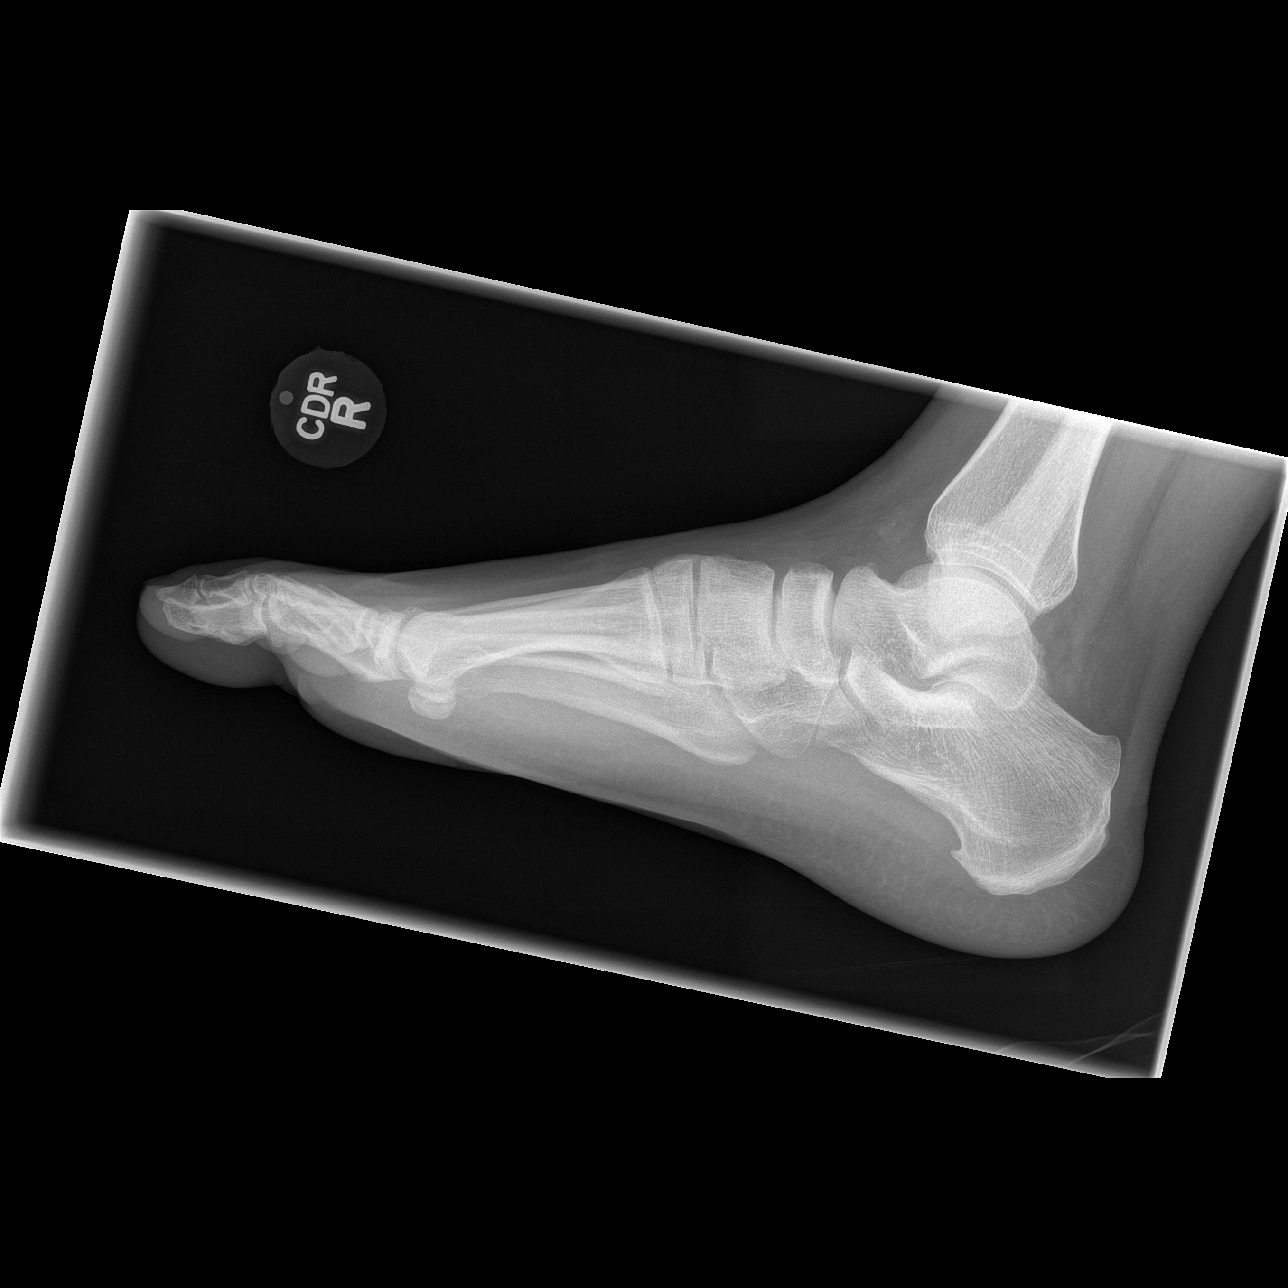

[3 of 3 positions shown; findings below may reference images not displayed]

FINDINGS: There is no evidence of fracture or dislocation. There is no
evidence of arthropathy or other focal bone abnormality. Soft
tissues are unremarkable.
IMPRESSION: Negative.
